# Patient Record
Sex: Male | Born: 1975 | Race: Black or African American | Hispanic: No | Smoking: Never smoker
Health system: Southern US, Community
[De-identification: ages and names within clinical notes are randomized; demographics above are authoritative.]

## PROBLEM LIST (undated history)

## (undated) DIAGNOSIS — I10 Essential (primary) hypertension: Secondary | ICD-10-CM

---

## 2016-05-27 ENCOUNTER — Other Ambulatory Visit: Payer: Self-pay | Admitting: Internal Medicine

## 2016-05-27 DIAGNOSIS — I10 Essential (primary) hypertension: Secondary | ICD-10-CM

## 2016-06-06 ENCOUNTER — Ambulatory Visit
Admission: RE | Admit: 2016-06-06 | Discharge: 2016-06-06 | Disposition: A | Discharge status: Home / Self Care | Payer: BLUE CROSS/BLUE SHIELD | Source: Ambulatory Visit | Attending: Internal Medicine | Admitting: Internal Medicine

## 2016-06-06 DIAGNOSIS — I10 Essential (primary) hypertension: Secondary | ICD-10-CM

## 2016-07-27 ENCOUNTER — Ambulatory Visit (INDEPENDENT_AMBULATORY_CARE_PROVIDER_SITE_OTHER): Payer: BLUE CROSS/BLUE SHIELD | Admitting: Endocrinology

## 2016-07-27 ENCOUNTER — Encounter: Payer: Self-pay | Admitting: Endocrinology

## 2016-07-27 VITALS — BP 138/102 | HR 68 | Ht 77.0 in | Wt 207.0 lb

## 2016-07-27 DIAGNOSIS — I10 Essential (primary) hypertension: Secondary | ICD-10-CM

## 2016-07-27 LAB — BASIC METABOLIC PANEL
BUN: 14 mg/dL (ref 6–23)
CALCIUM: 9.3 mg/dL (ref 8.4–10.5)
CO2: 35 meq/L — AB (ref 19–32)
Chloride: 97 mEq/L (ref 96–112)
Creatinine, Ser: 1.39 mg/dL (ref 0.40–1.50)
GFR: 72.89 mL/min (ref >60.00)
Glucose, Bld: 105 mg/dL — ABNORMAL HIGH (ref 70–99)
POTASSIUM: 3.4 meq/L — AB (ref 3.5–5.1)
SODIUM: 139 meq/L (ref 135–145)

## 2016-07-27 MED ORDER — DOXAZOSIN MESYLATE 4 MG PO TABS: 4.0000 mg | ORAL_TABLET | Freq: Every day | ORAL | 1 refills | Status: DC

## 2016-07-27 NOTE — Patient Instructions (Addendum)
Stop Losartan   For 2 days go on salt tabs 3 daily and do urine on 2nd day  Doxazosin 1/2 tab for 2 days then 1 daily instead of losartan  Omron BP meter

## 2016-07-27 NOTE — Progress Notes (Signed)
Patient ID: Frank Bernard, male   DOB: [birth year], 40 y.o.   MRN: [id number]            Chief complaint: High blood pressure  Referring physician: Dr. Renford Dills  History of Present Illness:  He was apparently diagnosed to have high blood pressure routine physical about 5-6 years ago He thinks he has been on several different blood pressure medicines in the past His history is not available and not clear what medications he has taken the last 2 months  He checks his blood pressure with a wrist treatment and it is variable.  He thinks occasionally systolic reading may be 200 and diastolic about 100 His last blood pressure with his PCP was 130/90.   He thinks that the most recent medication he is taking his losartan HCT, not clear if he was taking HCTZ before.  He has not had any known hypokalemia and his potassium in 5/17 was 4.0 His random serum aldosterone level was 15.5 with the renin activity of 0.425 He has had a normal creatinine and reportedly normal MRA of his renal arteries  No past medical history on file.  No past surgical history on file.  Family History  Problem Relation Age of Onset  . Hypertension Mother   . Heart disease Neg Hx   . Kidney disease Neg Hx     Social History:  reports that he has never smoked. He has never used smokeless tobacco. He reports that he does not drink alcohol. His drug history is not on file.  Allergies: No Known Allergies    Medication List       Accurate as of 07/27/16  3:46 PM. Always use your most recent med list.          amLODipine 10 MG tablet Commonly known as:  NORVASC Take 10 mg by mouth daily.   carvedilol 12.5 MG tablet Commonly known as:  COREG Take 12.5 mg by mouth 2 (two) times daily with a meal.   doxazosin 4 MG tablet Commonly known as:  CARDURA Take 1 tablet (4 mg total) by mouth daily.   losartan-hydrochlorothiazide 100-25 MG tablet Commonly known as:  HYZAAR Take 1 tablet by mouth daily.        LABS:  No visits with results within 1 Week(s) from this visit.  Latest known visit with results is:  No results found for any previous visit.        Review of Systems  Constitutional: Negative for weight loss.  HENT: Positive for headaches.        6 wks  Respiratory: Negative for shortness of breath.   Cardiovascular: Negative for chest pain, palpitations and leg swelling.  Gastrointestinal: Negative for abdominal pain.  Endocrine: Negative for fatigue.  Musculoskeletal: Negative for muscle cramps.  Skin:       No history of flushing  Neurological: Negative for weakness.     PHYSICAL EXAM:  BP (!) 138/102   Pulse 68   Ht 6\' 5"  (1.956 m)   Wt 207 lb (93.9 kg)   SpO2 98%   BMI 24.55 kg/m   GENERAL:Averagely built and nourished  No pallor, clubbing, lymphadenopathy or edema.  Skin:  no rash or pigmentation.  EYES:  Externally normal.  Fundii:  normal discs and vessels.  ENT: Oral mucosa and tongue normal.  THYROID:  Soft, minimally  enlarged on the right but left side not palpable.  HEART:  Normal  S1 and S2; no murmur or click.  CHEST:  Normal shape.  Lungs: Vescicular breath sounds heard equally.  No crepitations/ wheeze.  ABDOMEN:  No distention.  Liver and spleen not palpable.  No other mass or tenderness. No epigastric bruit heard  NEUROLOGICAL: .Reflexes are bilaterally normal at ankles.  JOINTS:  Normal.   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  Although he has had a relatively higher aldosterone/renin  ratio his labs were probably checked while he was taking losartan, HCTZ and a calcium blocker and may not be accurate However since he does appear to have difficulty controlling his hypertension with multiple drugs and without evidence of renal artery stenosis will need evaluation for adrenal disorders. Currently he is monitoring his blood pressure with the risk of not clear if this is accurate Currently no clinical  symptoms suggestive of pheochromocytoma present   PLAN:    For further evaluation will have him switch his losartan HCT To 4 mg doxazosin  24-hour urine aldosterone on salt loading with at least 6-8 g of sodium with sodium tablets and extra salt in the diet   Check 24-hour fractionated catecholamines  Raylyn Speckman 07/27/2016, 3:46 PM

## 2016-08-19 ENCOUNTER — Other Ambulatory Visit: Payer: BLUE CROSS/BLUE SHIELD

## 2016-08-19 ENCOUNTER — Other Ambulatory Visit: Payer: Self-pay | Admitting: Endocrinology

## 2016-08-19 DIAGNOSIS — I10 Essential (primary) hypertension: Secondary | ICD-10-CM

## 2016-08-24 ENCOUNTER — Ambulatory Visit: Payer: BLUE CROSS/BLUE SHIELD | Admitting: Endocrinology

## 2016-08-24 ENCOUNTER — Other Ambulatory Visit: Payer: BLUE CROSS/BLUE SHIELD

## 2016-08-24 DIAGNOSIS — I10 Essential (primary) hypertension: Secondary | ICD-10-CM

## 2016-08-28 ENCOUNTER — Telehealth: Payer: Self-pay | Admitting: Endocrinology

## 2016-08-28 ENCOUNTER — Other Ambulatory Visit: Payer: BLUE CROSS/BLUE SHIELD

## 2016-08-28 NOTE — Telephone Encounter (Signed)
Please call the pt with the results of his labs  His appt was scheduled incorrectly

## 2016-08-28 NOTE — Telephone Encounter (Signed)
Result is not yet available

## 2016-08-28 NOTE — Telephone Encounter (Signed)
See message. Can you review for Dr. Lucianne MussKumar while he is out?

## 2016-08-28 NOTE — Telephone Encounter (Signed)
I contacted the patient and advised of message. Patient voiced understanding.  

## 2016-09-15 ENCOUNTER — Other Ambulatory Visit: Payer: Self-pay

## 2016-09-15 LAB — CATECHOLAMINES, FRACTIONATED, URINE, 24 HOUR
DOPAMINE RANDOM UR: 227 ug/L
Dopamine , 24H Ur: 343 ug/24 hr (ref 0–510)
Epinephrine, 24H Ur: <2 ug/24 hr (ref 0–20)
NOREPINEPHRINE 24H UR: 20 ug/(24.h) (ref 0–135)
Norepinephrine, Rand Ur: 13 ug/L

## 2016-09-15 LAB — ALDOSTERONE, URINE
ALDOSTERONE 24H UR: 9.62 ug/(24.h) (ref 0.00–19.00)
ALDOSTERONE U, RANDOM: 6.37 ug/L

## 2016-09-15 LAB — CREATININE, URINE, 24 HOUR
CREATININE, UR: 121.4 mg/dL
Creatinine, 24H Ur: 1833 mg/24 hr (ref 1000–2000)

## 2016-09-21 ENCOUNTER — Ambulatory Visit (INDEPENDENT_AMBULATORY_CARE_PROVIDER_SITE_OTHER): Payer: BLUE CROSS/BLUE SHIELD | Admitting: Endocrinology

## 2016-09-21 ENCOUNTER — Encounter: Payer: Self-pay | Admitting: Endocrinology

## 2016-09-21 VITALS — BP 132/90 | HR 79 | Temp 98.5°F | Resp 16 | Ht 77.0 in | Wt 205.2 lb

## 2016-09-21 DIAGNOSIS — I10 Essential (primary) hypertension: Secondary | ICD-10-CM | POA: Diagnosis not present

## 2016-09-21 LAB — BASIC METABOLIC PANEL
BUN: 14 mg/dL (ref 6–23)
CALCIUM: 9.2 mg/dL (ref 8.4–10.5)
CO2: 31 meq/L (ref 19–32)
Chloride: 102 mEq/L (ref 96–112)
Creatinine, Ser: 1.3 mg/dL (ref 0.40–1.50)
GFR: 78.68 mL/min (ref >60.00)
GLUCOSE: 90 mg/dL (ref 70–99)
Potassium: 3.8 mEq/L (ref 3.5–5.1)
SODIUM: 138 meq/L (ref 135–145)

## 2016-09-21 MED ORDER — SPIRONOLACTONE 50 MG PO TABS: 50.0000 mg | ORAL_TABLET | Freq: Every day | ORAL | 2 refills | Status: DC

## 2016-09-21 MED ORDER — DOXAZOSIN MESYLATE 8 MG PO TABS: 4.0000 mg | ORAL_TABLET | Freq: Every day | ORAL | 1 refills | Status: DC

## 2016-09-21 NOTE — Progress Notes (Signed)
Please let patient know that the  Potassium result is normal and no further action needed

## 2016-09-21 NOTE — Patient Instructions (Addendum)
Doxazosin 8mg    Aldactone 50 mg daily  Low sodium diet

## 2016-09-21 NOTE — Progress Notes (Signed)
Patient ID: Frank Bernard, male   DOB: 06/14/1976, 40 y.o.   MRN: [id number]            Chief complaint: High blood pressure  Referring physician: Dr. Renford Dillsonald Polite  History of Present Illness:  Background information:  He was apparently diagnosed to have high blood pressure routine physical about 5-6 years ago He thinks he has been on several different blood pressure medicines in the past He thinks clonidine did not help his blood pressure control His history is not available and not clear what medications he has taken the last 2 months  He checks his blood pressure with a wrist treatment and it is variable.   He thinks occasionally systolic reading may be 200 and diastolic about 100 His last blood pressure with his PCP was 130/90. On his initial consultation he had been started by his PCP on  losartan HCT, not clear if he was taking HCTZ before.  He has not had any known hypokalemia and his potassium in 5/17 was 4.0 His random serum aldosterone level was 15.5 with the renin activity of 0.425 He has had a normal creatinine and reportedly normal MRA of his renal arteries  RECENT history:  To evaluate his adrenals for secondary hypertension he was advised to do salt loading for 3 days and collect a urine for 24 hours for aldosterone His aldosterone level over 24 hours was 9.6, normal Catecholamines were normal in the 24-hour urine also  He has been started on doxazosin instead of losartan HCTZ because of his hypokalemia and to evaluate his aldosterone levels He says he does not have a reliable blood pressure monitor at home  Lab Results  Component Value Date   CREATININE 1.39 07/27/2016   BUN 14 07/27/2016   NA 139 07/27/2016   K 3.4 (L) 07/27/2016   CL 97 07/27/2016   CO2 35 (H) 07/27/2016     No past medical history on file.  No past surgical history on file.  Family History  Problem Relation Age of Onset  . Hypertension Mother   . Heart disease Neg Hx   . Kidney  disease Neg Hx     Social History:  reports that he has never smoked. He has never used smokeless tobacco. He reports that he does not drink alcohol. His drug history is not on file.  Allergies: No Known Allergies    Medication List       Accurate as of 09/21/16  3:58 PM. Always use your most recent med list.          amLODipine 10 MG tablet Commonly known as:  NORVASC Take 10 mg by mouth daily.   carvedilol 12.5 MG tablet Commonly known as:  COREG Take 12.5 mg by mouth 2 (two) times daily with a meal.   doxazosin 4 MG tablet Commonly known as:  CARDURA Take 1 tablet (4 mg total) by mouth daily.   losartan-hydrochlorothiazide 100-25 MG tablet Commonly known as:  HYZAAR Take 1 tablet by mouth daily.       LABS:  No visits with results within 1 Week(s) from this visit.  Latest known visit with results is:  Appointment on 08/24/2016  Component Date Value Ref Range Status  . Creatinine, Urine 09/15/2016 121.4  Not Estab. mg/dL Final  . Creatinine, 40J24H Ur 09/15/2016 1833  1,000 - 2,000 mg/24 hr Final  . Epinephrine, Rand Ur 09/15/2016 <1  Undefined ug/L Final  . Epinephrine, 24H Ur 09/15/2016 <2  0 - 20  ug/24 hr Final  . Norepinephrine, Rand Ur 09/15/2016 13  Undefined ug/L Final  . Norepinephrine, 24H Ur 09/15/2016 20  0 - 135 ug/24 hr Final  . Dopamine, Rand Ur 09/15/2016 227  Undefined ug/L Final  . Dopamine , 24H Ur 09/15/2016 343  0 - 510 ug/24 hr Final  . Aldosterone U,Random 09/15/2016 6.37  Not Estab. ug/L Final  . Aldosterone, 24H Ur 09/15/2016 9.62  0.00 - 19.00 ug/24 hr Final   Comment:                                  Adult Ranges                     Low Sodium Intake     20.00 - 80.00                     Normal Sodium Intake   0.00 - 19.00                     High Sodium Intake     0.00 - 12.00 This test was developed and its performance characteristics determined by [facility]. It has not been cleared or approved by the Food and Drug Administration.          Review of Systems  Constitutional: Negative for weight loss.  HENT: Positive for headaches.        6 wks  Respiratory: Negative for shortness of breath.   Cardiovascular: Negative for chest pain, palpitations and leg swelling.  Gastrointestinal: Negative for abdominal pain.  Endocrine: Negative for fatigue.  Musculoskeletal: Negative for muscle cramps.  Skin:       No history of flushing  Neurological: Negative for weakness.     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  Evaluation of aldosterone and catecholamines is normal  Unclear why he has resisted hypertension She thinks his sodium intake is relatively low   PLAN:   Empirically he will try Aldactone 50 mg daily Increase doxazosin to 8 mg Follow-up in 4 weeks Check metabolic panel today  Frank Bernard 09/21/2016, 3:58 PM

## 2017-02-04 ENCOUNTER — Other Ambulatory Visit: Payer: Self-pay | Admitting: Endocrinology

## 2017-03-09 ENCOUNTER — Other Ambulatory Visit: Payer: Self-pay | Admitting: Endocrinology

## 2017-04-10 ENCOUNTER — Other Ambulatory Visit: Payer: Self-pay | Admitting: Endocrinology

## 2017-04-15 DIAGNOSIS — Z0000 Encounter for general adult medical examination without abnormal findings: Secondary | ICD-10-CM | POA: Diagnosis not present

## 2017-04-15 DIAGNOSIS — Z23 Encounter for immunization: Secondary | ICD-10-CM | POA: Diagnosis not present

## 2017-05-06 DIAGNOSIS — I10 Essential (primary) hypertension: Secondary | ICD-10-CM | POA: Diagnosis not present

## 2017-10-06 ENCOUNTER — Encounter (HOSPITAL_COMMUNITY): Payer: Self-pay | Admitting: Emergency Medicine

## 2017-10-06 ENCOUNTER — Emergency Department (HOSPITAL_COMMUNITY): Payer: 59

## 2017-10-06 DIAGNOSIS — R072 Precordial pain: Secondary | ICD-10-CM | POA: Insufficient documentation

## 2017-10-06 DIAGNOSIS — Z79899 Other long term (current) drug therapy: Secondary | ICD-10-CM | POA: Insufficient documentation

## 2017-10-06 DIAGNOSIS — I10 Essential (primary) hypertension: Secondary | ICD-10-CM | POA: Insufficient documentation

## 2017-10-06 DIAGNOSIS — R079 Chest pain, unspecified: Secondary | ICD-10-CM | POA: Diagnosis present

## 2017-10-06 LAB — CBC
HEMATOCRIT: 40.2 % (ref 39.0–52.0)
HEMOGLOBIN: 13.1 g/dL (ref 13.0–17.0)
MCH: 22.4 pg — AB (ref 26.0–34.0)
MCHC: 32.6 g/dL (ref 30.0–36.0)
MCV: 68.8 fL — AB (ref 78.0–100.0)
Platelets: 215 10*3/uL (ref 150–400)
RBC: 5.84 MIL/uL — ABNORMAL HIGH (ref 4.22–5.81)
RDW: 16 % — AB (ref 11.5–15.5)
WBC: 5 10*3/uL (ref 4.0–10.5)

## 2017-10-06 LAB — BASIC METABOLIC PANEL
ANION GAP: 7 (ref 5–15)
BUN: 13 mg/dL (ref 6–20)
CHLORIDE: 104 mmol/L (ref 101–111)
CO2: 26 mmol/L (ref 22–32)
Calcium: 9.8 mg/dL (ref 8.9–10.3)
Creatinine, Ser: 1.24 mg/dL (ref 0.61–1.24)
GFR calc Af Amer: >60 mL/min (ref >60)
GFR calc non Af Amer: >60 mL/min (ref >60)
GLUCOSE: 103 mg/dL — AB (ref 65–99)
POTASSIUM: 4.6 mmol/L (ref 3.5–5.1)
Sodium: 137 mmol/L (ref 135–145)

## 2017-10-06 LAB — POCT I-STAT TROPONIN I: Troponin i, poc: 0 ng/mL (ref 0.00–0.08)

## 2017-10-06 NOTE — ED Notes (Signed)
Pt had drawn for labs:  Gold Blue Lavender Lt green Dark green x2 

## 2017-10-06 NOTE — ED Triage Notes (Signed)
Pt with chest pain in the center of chest, he denies sob, diaphoresis or nausea. Currently the pain is 0/10 but states when he presented here it was present. Hx of HTN.

## 2017-10-07 ENCOUNTER — Emergency Department (HOSPITAL_COMMUNITY)
Admission: EM | Admit: 2017-10-07 | Discharge: 2017-10-07 | Disposition: A | Discharge status: Home / Self Care | Payer: 59 | Attending: Emergency Medicine | Admitting: Emergency Medicine

## 2017-10-07 DIAGNOSIS — R072 Precordial pain: Secondary | ICD-10-CM

## 2017-10-07 HISTORY — DX: Essential (primary) hypertension: I10

## 2017-10-07 LAB — POCT I-STAT TROPONIN I: Troponin i, poc: 0 ng/mL (ref 0.00–0.08)

## 2017-10-07 NOTE — Discharge Instructions (Signed)

## 2017-10-07 NOTE — ED Provider Notes (Signed)
[facility]-EMERGENCY DEPT Provider Note   CSN: [id number] Arrival date & time: 10/06/17  1734     History   Chief Complaint Chief Complaint  Patient presents with  . Chest Pain    HPI Frank Bernard is a 41 y.o. male.  The history is provided by the patient.  Chest Pain   This is a new problem. Episode onset: several hours prior to arrival. The problem occurs constantly. The problem has been resolved. The pain is present in the substernal region. The pain is moderate. The quality of the pain is described as burning. The pain does not radiate. Pertinent negatives include no abdominal pain, no cough, no exertional chest pressure, no fever, no leg pain, no lower extremity edema, no shortness of breath, no syncope and no vomiting. He has tried nothing for the symptoms. Risk factors include male gender.  His past medical history is significant for hypertension.  Pertinent negatives for past medical history include no CAD.  Pertinent negatives for family medical history include: no CAD.  pt reports onset of CP earlier in the day, now resolved It was not exertional or pleuritic It did not radiate into back No associated symptoms The CP came at random He is a nonsmoker, no drug use  Past Medical History:  Diagnosis Date  . Hypertension     There are no active problems to display for this patient.   History reviewed. No pertinent surgical history.     Home Medications    Prior to Admission medications   Medication Sig Start Date End Date Taking? Authorizing Provider  amLODipine (NORVASC) 10 MG tablet Take 10 mg by mouth daily.    [provider]  carvedilol (COREG) 12.5 MG tablet Take 12.5 mg by mouth 2 (two) times daily with a meal.    [provider]  doxazosin (CARDURA) 8 MG tablet TAKE ONE-HALF TABLET BY MOUTH ONCE DAILY 03/09/17   Reather Littler, MD  spironolactone (ALDACTONE) 50 MG tablet TAKE ONE TABLET BY MOUTH ONCE DAILY 02/05/17   Reather Littler, MD    Family  History Family History  Problem Relation Age of Onset  . Hypertension Mother   . Heart disease Neg Hx   . Kidney disease Neg Hx     Social History Social History  Substance Use Topics  . Smoking status: Never Smoker  . Smokeless tobacco: Never Used  . Alcohol use No     Allergies   Patient has no known allergies.   Review of Systems Review of Systems  Constitutional: Negative for fever.  Respiratory: Negative for cough and shortness of breath.   Cardiovascular: Positive for chest pain. Negative for leg swelling and syncope.  Gastrointestinal: Negative for abdominal pain and vomiting.  All other systems reviewed and are negative.    Physical Exam Updated Vital Signs BP 121/88 (BP Location: Right Arm)   Pulse (!) 54   Temp 98.4 F (36.9 C) (Oral)   Resp 15   SpO2 100%   Physical Exam CONSTITUTIONAL: Well developed/well nourished HEAD: Normocephalic/atraumatic EYES: EOMI/PERRL ENMT: Mucous membranes moist NECK: supple no meningeal signs SPINE/BACK:entire spine nontender CV: S1/S2 noted, no murmurs/rubs/gallops noted LUNGS: Lungs are clear to auscultation bilaterally, no apparent distress ABDOMEN: soft, nontender, no rebound or guarding, bowel sounds noted throughout abdomen GU:no cva tenderness NEURO: Pt is awake/alert/appropriate, moves all extremitiesx4.  No facial droop.   EXTREMITIES: pulses normal/equal, full ROM, no calf tenderness or edema SKIN: warm, color normal PSYCH: no abnormalities of mood noted, alert and  oriented to situation   ED Treatments / Results  Labs (all labs ordered are listed, but only abnormal results are displayed) Labs Reviewed  BASIC METABOLIC PANEL - Abnormal; Notable for the following:       Result Value   Glucose, Bld 103 (*)    All other components within normal limits  CBC - Abnormal; Notable for the following:    RBC 5.84 (*)    MCV 68.8 (*)    MCH 22.4 (*)    RDW 16.0 (*)    All other components within normal  limits  POCT I-STAT TROPONIN I  I-STAT TROPONIN, ED  POCT I-STAT TROPONIN I    EKG  EKG Interpretation  Date/Time:  Wednesday October 06 2017 17:50:23 EDT Ventricular Rate:  65 PR Interval:    QRS Duration: 83 QT Interval:  386 QTC Calculation: 402 R Axis:   54 Text Interpretation:  Sinus rhythm ST elev, probable normal early repol pattern No previous ECGs available Confirmed by Zadie Rhine ([id number]) on 10/07/2017 1:01:04 AM       Radiology Dg Chest 2 View  Result Date: 10/06/2017 CLINICAL DATA:  Central chest pain intermittently for 3-4 days, history hypertension EXAM: CHEST  2 VIEW COMPARISON:  None FINDINGS: Normal heart size, mediastinal contours, and pulmonary vascularity. Lungs clear. No pleural effusion or pneumothorax. Bones unremarkable. IMPRESSION: Normal exam. Electronically Signed   By: Ulyses Southward M.D.   On: 10/06/2017 19:16    Procedures Procedures (including critical care time)  Medications Ordered in ED Medications - No data to display   Initial Impression / Assessment and Plan / ED Course  I have reviewed the triage vital signs and the nursing notes.  Pertinent labs & imaging results that were available during my care of the patient were reviewed by me and considered in my medical decision making (see chart for details).     Patient stable HEART score 3 with 2 negative troponins He appears PERC negative I doubt aortic dissection given how well appearing patient is at this time Will d/c home Referred to PCP We discussed strict ER return precautions   Final Clinical Impressions(s) / ED Diagnoses   Final diagnoses:  Precordial pain    New Prescriptions Discharge Medication List as of 10/07/2017  1:50 AM       Zadie Rhine, MD 10/07/17 0310

## 2018-04-26 DIAGNOSIS — I10 Essential (primary) hypertension: Secondary | ICD-10-CM | POA: Diagnosis not present

## 2018-04-26 DIAGNOSIS — Z13220 Encounter for screening for lipoid disorders: Secondary | ICD-10-CM | POA: Diagnosis not present

## 2018-09-13 DIAGNOSIS — R300 Dysuria: Secondary | ICD-10-CM | POA: Diagnosis not present

## 2019-02-27 DIAGNOSIS — R42 Dizziness and giddiness: Secondary | ICD-10-CM | POA: Diagnosis not present

## 2019-03-03 DIAGNOSIS — R718 Other abnormality of red blood cells: Secondary | ICD-10-CM | POA: Diagnosis not present

## 2019-03-03 DIAGNOSIS — Z79899 Other long term (current) drug therapy: Secondary | ICD-10-CM | POA: Diagnosis not present

## 2019-03-03 DIAGNOSIS — I10 Essential (primary) hypertension: Secondary | ICD-10-CM | POA: Diagnosis not present

## 2019-03-06 DIAGNOSIS — R718 Other abnormality of red blood cells: Secondary | ICD-10-CM | POA: Diagnosis not present

## 2019-03-06 DIAGNOSIS — Z79899 Other long term (current) drug therapy: Secondary | ICD-10-CM | POA: Diagnosis not present

## 2019-03-20 ENCOUNTER — Other Ambulatory Visit: Payer: Self-pay | Admitting: Internal Medicine

## 2019-03-20 DIAGNOSIS — R51 Headache: Secondary | ICD-10-CM | POA: Diagnosis not present

## 2019-03-20 DIAGNOSIS — I10 Essential (primary) hypertension: Secondary | ICD-10-CM | POA: Diagnosis not present

## 2019-03-20 DIAGNOSIS — G4452 New daily persistent headache (NDPH): Secondary | ICD-10-CM

## 2019-03-22 ENCOUNTER — Other Ambulatory Visit: Payer: 59

## 2019-03-23 ENCOUNTER — Ambulatory Visit
Admission: RE | Admit: 2019-03-23 | Discharge: 2019-03-23 | Disposition: A | Discharge status: Home / Self Care | Payer: 59 | Source: Ambulatory Visit | Attending: Internal Medicine | Admitting: Internal Medicine

## 2019-03-23 ENCOUNTER — Other Ambulatory Visit: Payer: Self-pay

## 2019-03-23 DIAGNOSIS — G4452 New daily persistent headache (NDPH): Secondary | ICD-10-CM

## 2019-03-23 DIAGNOSIS — R51 Headache: Secondary | ICD-10-CM | POA: Diagnosis not present

## 2019-03-27 DIAGNOSIS — R51 Headache: Secondary | ICD-10-CM | POA: Diagnosis not present

## 2019-03-27 DIAGNOSIS — I10 Essential (primary) hypertension: Secondary | ICD-10-CM | POA: Diagnosis not present

## 2019-03-27 DIAGNOSIS — R42 Dizziness and giddiness: Secondary | ICD-10-CM | POA: Diagnosis not present

## 2019-05-16 ENCOUNTER — Other Ambulatory Visit: Payer: 59

## 2019-10-15 IMAGING — CT CT HEAD WITHOUT CONTRAST
1 series · 16 of 30 positions shown, 20 images · non-contrast
Comparison: None.

CLINICAL DATA: Severe headaches for 1 month

EXAM:
CT HEAD WITHOUT CONTRAST
TECHNIQUE: Contiguous axial images were obtained from the base of the skull
through the vertex without intravenous contrast.

[Series 2: head w/(date) · axial · 0.46mm/px · z∈[-156,+4]mm · 16 of 36 slices shown, 20 images]
[im 2/36  brain]
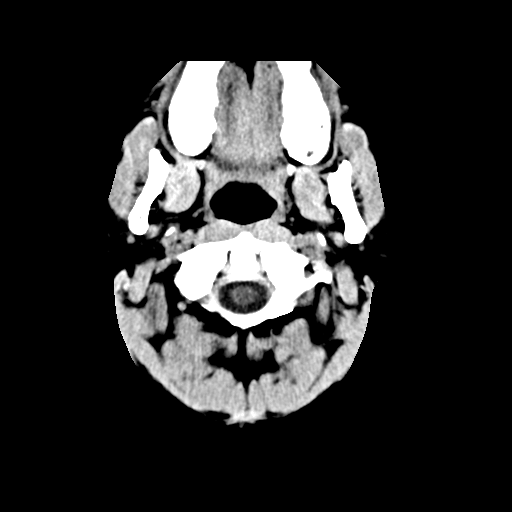
[im 2/36  bone]
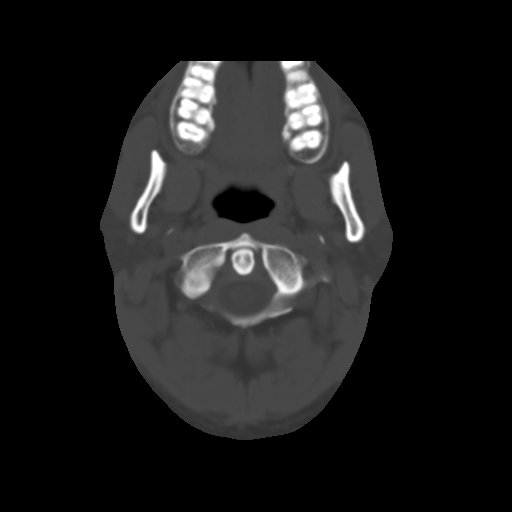
[im 4/36  brain]
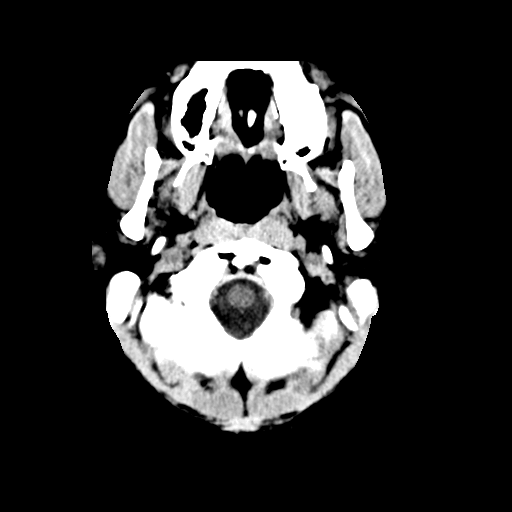
[im 7/36  brain]
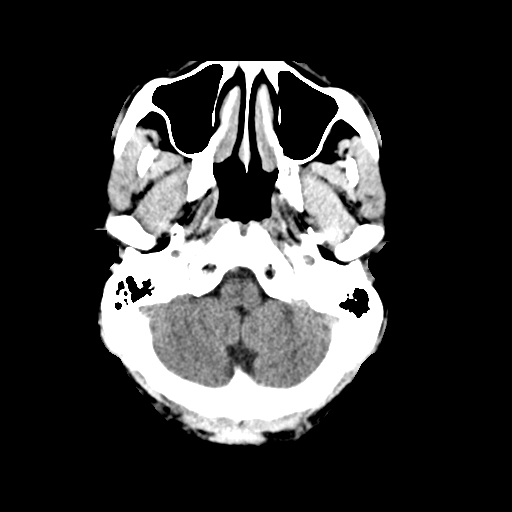
[im 9/36  brain]
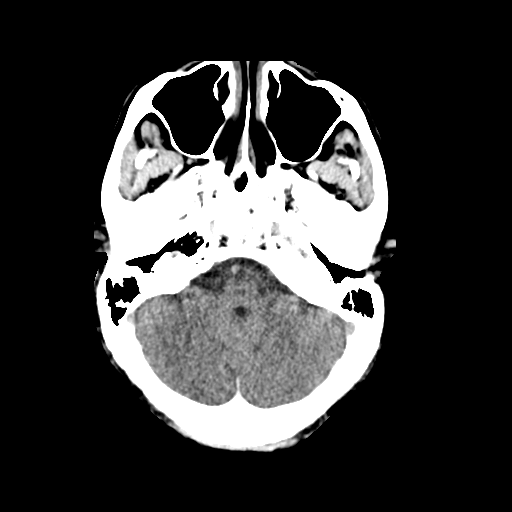
[im 10/36  brain]
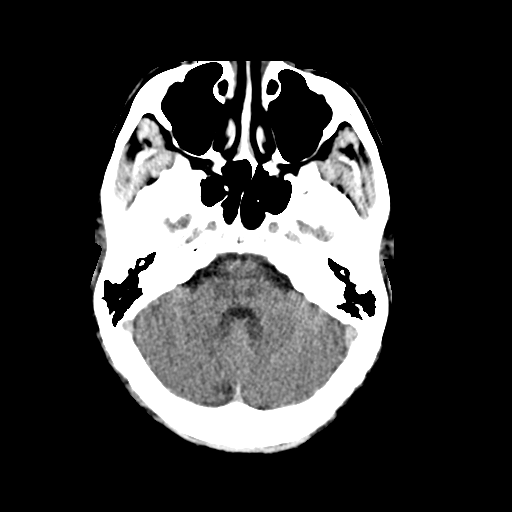
[im 10/36  bone]
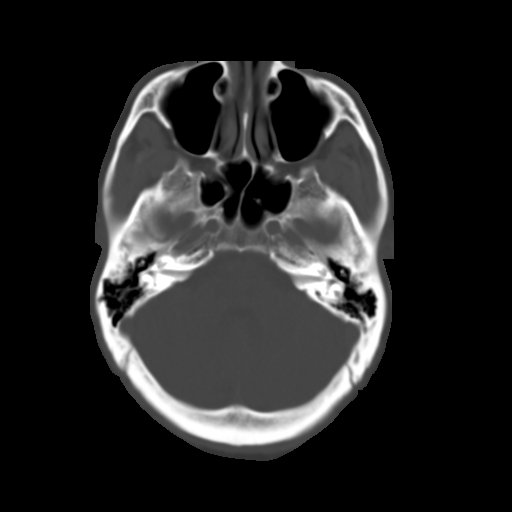
[im 13/36  brain]
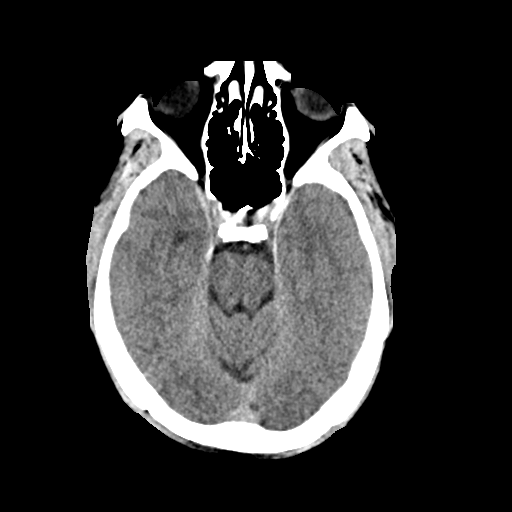
[im 15/36  brain]
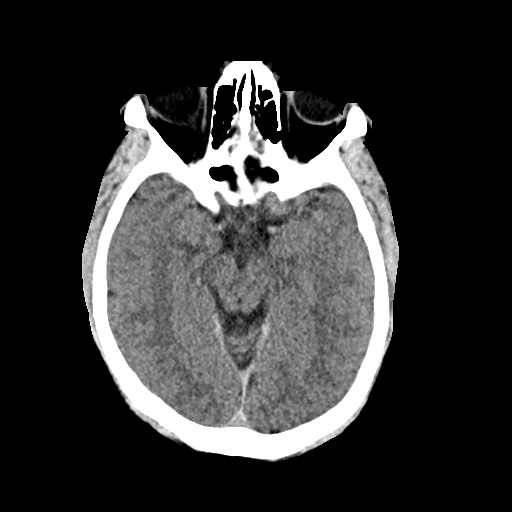
[im 17/36  brain]
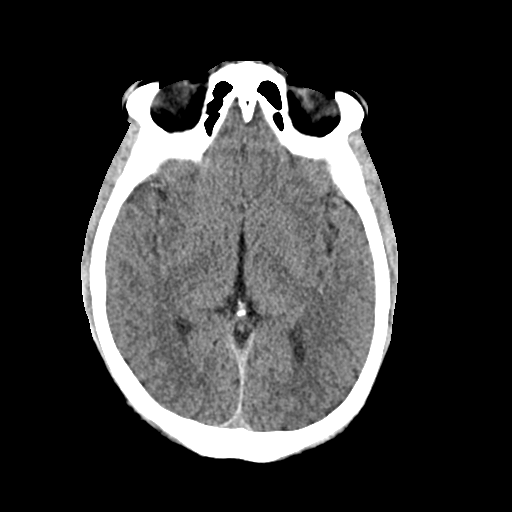
[im 19/36  brain]
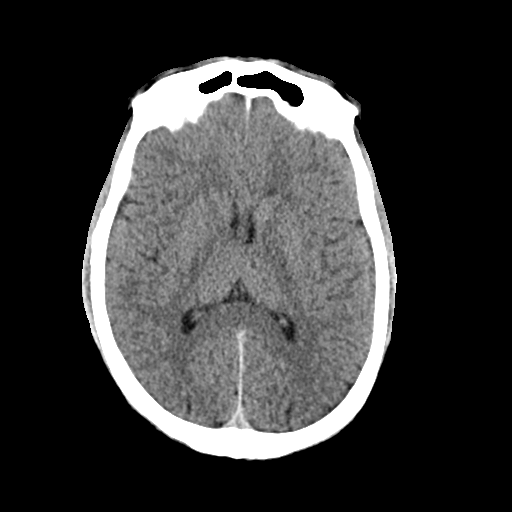
[im 19/36  bone]
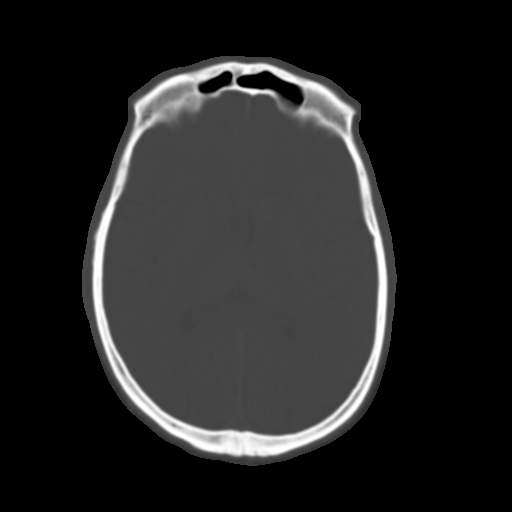
[im 21/36  brain]
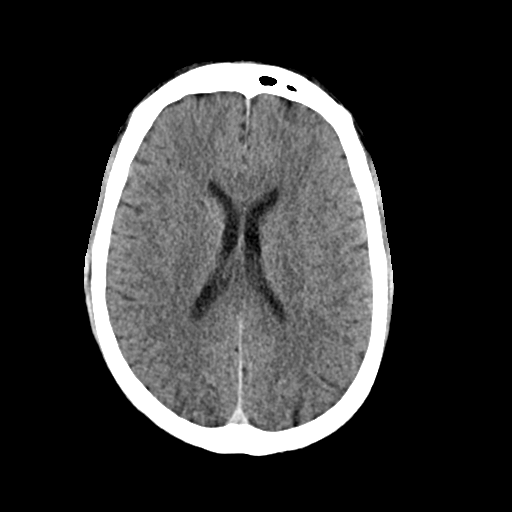
[im 23/36  brain]
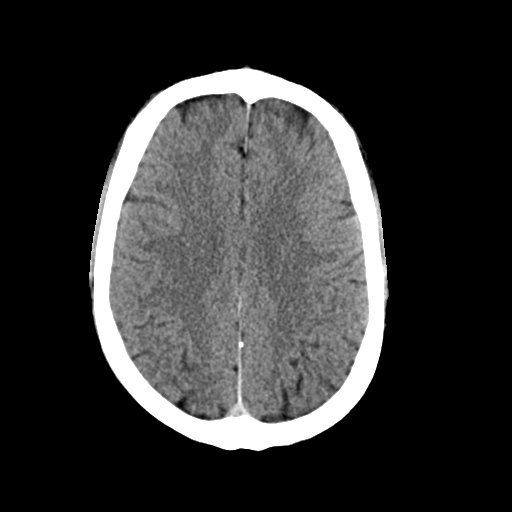
[im 26/36  brain]
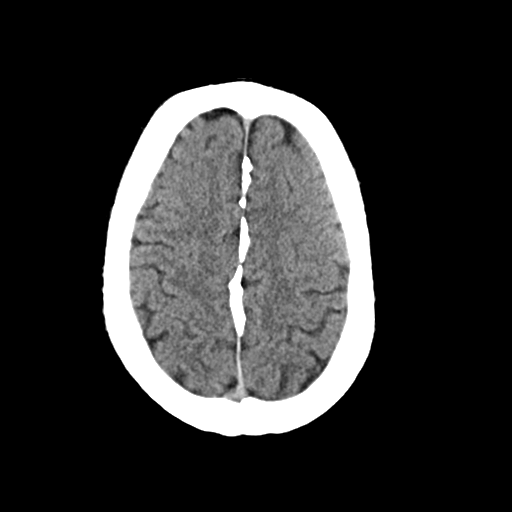
[im 27/36  brain]
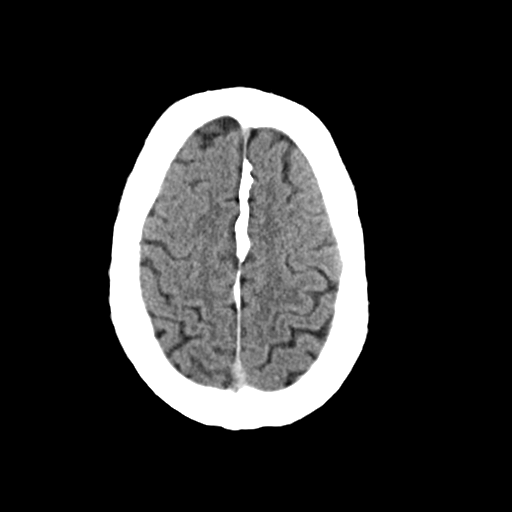
[im 27/36  bone]
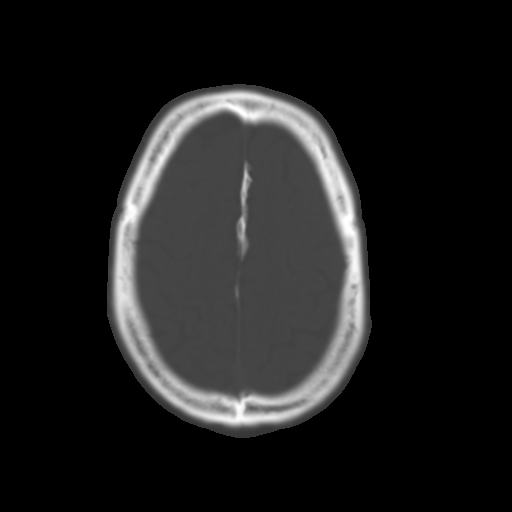
[im 29/36  brain]
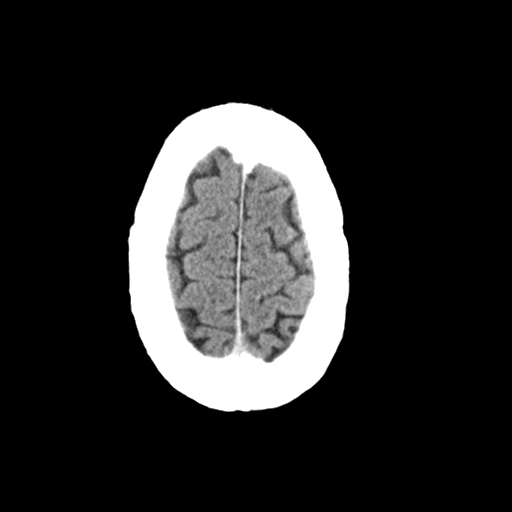
[im 32/36  brain]
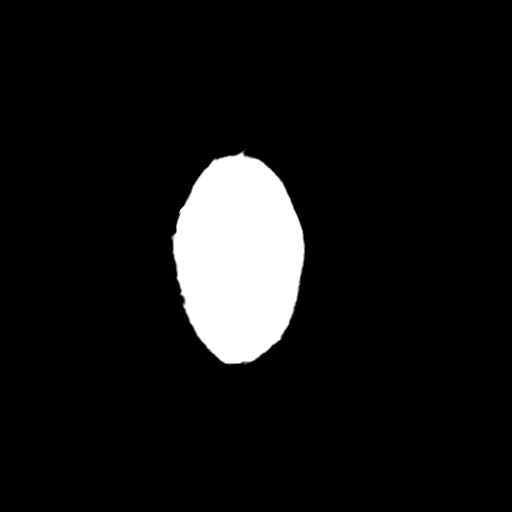
[im 34/36  brain]
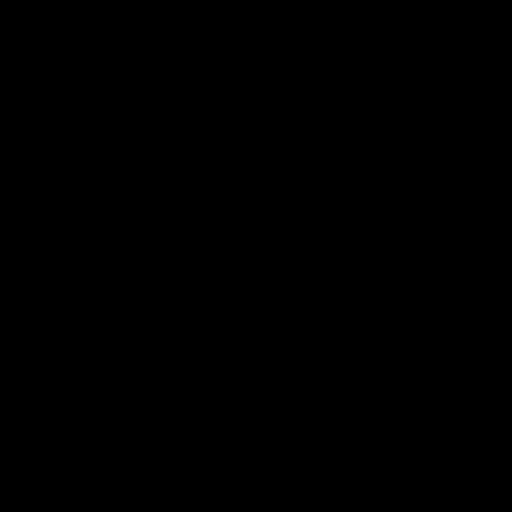

[16 of 30 positions shown; findings below may reference images not displayed]

FINDINGS: Brain: No evidence of acute infarction, hemorrhage, hydrocephalus,
extra-axial collection or mass lesion/mass effect.

Vascular: No hyperdense vessel or unexpected calcification.

Skull: No osseous abnormality.

Sinuses/Orbits: Left maxillary sinus mucous retention cyst.
Visualized mastoid sinuses are clear. Visualized orbits demonstrate
no focal abnormality.

Other: None
IMPRESSION: No acute intracranial pathology.
